# Patient Record
Sex: Female | Born: 1995 | Hispanic: Refuse to answer | Marital: Single | Smoking: Never smoker
Health system: Southern US, Community
[De-identification: ages and names within clinical notes are randomized; demographics above are authoritative.]

## PROBLEM LIST (undated history)

## (undated) DIAGNOSIS — F419 Anxiety disorder, unspecified: Secondary | ICD-10-CM

## (undated) DIAGNOSIS — M25552 Pain in left hip: Secondary | ICD-10-CM

## (undated) DIAGNOSIS — M25551 Pain in right hip: Secondary | ICD-10-CM

## (undated) DIAGNOSIS — G8929 Other chronic pain: Secondary | ICD-10-CM

## (undated) DIAGNOSIS — N390 Urinary tract infection, site not specified: Secondary | ICD-10-CM

## (undated) HISTORY — DX: Anxiety disorder, unspecified: F41.9

---
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
## 2016-04-09 DIAGNOSIS — M9902 Segmental and somatic dysfunction of thoracic region: Secondary | ICD-10-CM | POA: Diagnosis not present

## 2016-04-09 DIAGNOSIS — M9901 Segmental and somatic dysfunction of cervical region: Secondary | ICD-10-CM | POA: Diagnosis not present

## 2016-04-09 DIAGNOSIS — M9903 Segmental and somatic dysfunction of lumbar region: Secondary | ICD-10-CM | POA: Diagnosis not present

## 2016-04-09 DIAGNOSIS — M9905 Segmental and somatic dysfunction of pelvic region: Secondary | ICD-10-CM | POA: Diagnosis not present

## 2016-04-13 DIAGNOSIS — M7521 Bicipital tendinitis, right shoulder: Secondary | ICD-10-CM | POA: Diagnosis not present

## 2016-05-02 DIAGNOSIS — J029 Acute pharyngitis, unspecified: Secondary | ICD-10-CM | POA: Diagnosis not present

## 2016-05-15 DIAGNOSIS — M9902 Segmental and somatic dysfunction of thoracic region: Secondary | ICD-10-CM | POA: Diagnosis not present

## 2016-05-15 DIAGNOSIS — M9905 Segmental and somatic dysfunction of pelvic region: Secondary | ICD-10-CM | POA: Diagnosis not present

## 2016-05-15 DIAGNOSIS — M9903 Segmental and somatic dysfunction of lumbar region: Secondary | ICD-10-CM | POA: Diagnosis not present

## 2016-05-15 DIAGNOSIS — M9901 Segmental and somatic dysfunction of cervical region: Secondary | ICD-10-CM | POA: Diagnosis not present

## 2016-05-30 DIAGNOSIS — M9903 Segmental and somatic dysfunction of lumbar region: Secondary | ICD-10-CM | POA: Diagnosis not present

## 2016-05-30 DIAGNOSIS — M9905 Segmental and somatic dysfunction of pelvic region: Secondary | ICD-10-CM | POA: Diagnosis not present

## 2016-05-30 DIAGNOSIS — M9902 Segmental and somatic dysfunction of thoracic region: Secondary | ICD-10-CM | POA: Diagnosis not present

## 2016-05-30 DIAGNOSIS — M9901 Segmental and somatic dysfunction of cervical region: Secondary | ICD-10-CM | POA: Diagnosis not present

## 2016-06-07 DIAGNOSIS — M9905 Segmental and somatic dysfunction of pelvic region: Secondary | ICD-10-CM | POA: Diagnosis not present

## 2016-06-07 DIAGNOSIS — M9903 Segmental and somatic dysfunction of lumbar region: Secondary | ICD-10-CM | POA: Diagnosis not present

## 2016-06-07 DIAGNOSIS — M9902 Segmental and somatic dysfunction of thoracic region: Secondary | ICD-10-CM | POA: Diagnosis not present

## 2016-06-07 DIAGNOSIS — M9901 Segmental and somatic dysfunction of cervical region: Secondary | ICD-10-CM | POA: Diagnosis not present

## 2016-06-13 DIAGNOSIS — M9902 Segmental and somatic dysfunction of thoracic region: Secondary | ICD-10-CM | POA: Diagnosis not present

## 2016-06-13 DIAGNOSIS — M9901 Segmental and somatic dysfunction of cervical region: Secondary | ICD-10-CM | POA: Diagnosis not present

## 2016-06-13 DIAGNOSIS — M9905 Segmental and somatic dysfunction of pelvic region: Secondary | ICD-10-CM | POA: Diagnosis not present

## 2016-06-13 DIAGNOSIS — M9903 Segmental and somatic dysfunction of lumbar region: Secondary | ICD-10-CM | POA: Diagnosis not present

## 2016-06-19 DIAGNOSIS — B373 Candidiasis of vulva and vagina: Secondary | ICD-10-CM | POA: Diagnosis not present

## 2016-06-19 DIAGNOSIS — R350 Frequency of micturition: Secondary | ICD-10-CM | POA: Diagnosis not present

## 2016-07-03 DIAGNOSIS — M9903 Segmental and somatic dysfunction of lumbar region: Secondary | ICD-10-CM | POA: Diagnosis not present

## 2016-07-03 DIAGNOSIS — M9901 Segmental and somatic dysfunction of cervical region: Secondary | ICD-10-CM | POA: Diagnosis not present

## 2016-07-03 DIAGNOSIS — M9905 Segmental and somatic dysfunction of pelvic region: Secondary | ICD-10-CM | POA: Diagnosis not present

## 2016-07-03 DIAGNOSIS — M9902 Segmental and somatic dysfunction of thoracic region: Secondary | ICD-10-CM | POA: Diagnosis not present

## 2016-07-06 DIAGNOSIS — Z01419 Encounter for gynecological examination (general) (routine) without abnormal findings: Secondary | ICD-10-CM | POA: Diagnosis not present

## 2016-07-06 DIAGNOSIS — Z23 Encounter for immunization: Secondary | ICD-10-CM | POA: Diagnosis not present

## 2016-07-10 DIAGNOSIS — M9902 Segmental and somatic dysfunction of thoracic region: Secondary | ICD-10-CM | POA: Diagnosis not present

## 2016-07-10 DIAGNOSIS — M9905 Segmental and somatic dysfunction of pelvic region: Secondary | ICD-10-CM | POA: Diagnosis not present

## 2016-07-10 DIAGNOSIS — M9903 Segmental and somatic dysfunction of lumbar region: Secondary | ICD-10-CM | POA: Diagnosis not present

## 2016-07-10 DIAGNOSIS — M9901 Segmental and somatic dysfunction of cervical region: Secondary | ICD-10-CM | POA: Diagnosis not present

## 2016-07-12 DIAGNOSIS — M9901 Segmental and somatic dysfunction of cervical region: Secondary | ICD-10-CM | POA: Diagnosis not present

## 2016-07-12 DIAGNOSIS — M9905 Segmental and somatic dysfunction of pelvic region: Secondary | ICD-10-CM | POA: Diagnosis not present

## 2016-07-12 DIAGNOSIS — M9903 Segmental and somatic dysfunction of lumbar region: Secondary | ICD-10-CM | POA: Diagnosis not present

## 2016-07-12 DIAGNOSIS — M9902 Segmental and somatic dysfunction of thoracic region: Secondary | ICD-10-CM | POA: Diagnosis not present

## 2016-09-28 DIAGNOSIS — F411 Generalized anxiety disorder: Secondary | ICD-10-CM | POA: Diagnosis not present

## 2016-10-22 ENCOUNTER — Ambulatory Visit (INDEPENDENT_AMBULATORY_CARE_PROVIDER_SITE_OTHER): Payer: BLUE CROSS/BLUE SHIELD | Admitting: Family Medicine

## 2016-10-22 ENCOUNTER — Other Ambulatory Visit: Payer: Self-pay | Admitting: Family Medicine

## 2016-10-22 ENCOUNTER — Encounter: Payer: Self-pay | Admitting: Family Medicine

## 2016-10-22 ENCOUNTER — Ambulatory Visit
Admission: RE | Admit: 2016-10-22 | Discharge: 2016-10-22 | Disposition: A | Discharge status: Home / Self Care | Payer: BLUE CROSS/BLUE SHIELD | Source: Ambulatory Visit | Attending: Family Medicine | Admitting: Family Medicine

## 2016-10-22 VITALS — BP 116/68 | HR 76 | Temp 98.6°F | Resp 14

## 2016-10-22 DIAGNOSIS — J069 Acute upper respiratory infection, unspecified: Secondary | ICD-10-CM

## 2016-10-22 DIAGNOSIS — R059 Cough, unspecified: Secondary | ICD-10-CM

## 2016-10-22 DIAGNOSIS — R05 Cough: Secondary | ICD-10-CM

## 2016-10-22 LAB — POCT INFLUENZA A/B
Influenza A, POC: NEGATIVE
Influenza B, POC: NEGATIVE

## 2016-10-22 MED ORDER — METHYLPREDNISOLONE 4 MG PO TBPK: ORAL_TABLET | ORAL | 0 refills | Status: AC

## 2016-10-22 NOTE — Progress Notes (Signed)
Patient presents today with symptoms of productive cough and ear pain. Patient states that she was diagnosed with bronchitis and otitis media at the [facility]. She was put on Zithromax. Patient states that initially she did have fever and body aches but that has resolved. She has an inhaler to use as needed as well. She denies any chest pain, abdominal pain, severe headache, nausea, vomiting, diarrhea, urinary symptoms. She has been taking Mucinex as well for her symptoms.  ROS: Negative except mentioned above. Vitals as per Epic.  GENERAL: NAD HEENT: mild pharyngeal erythema, no exudate, mild erythema of TMs bilaterally, no cervical LAD RESP: Mildly decreased breath sounds at the bases, mild wheezing, no accessory muscle use noted CARD: RRR NEURO: CN II-XII grossly intact   A/P: URI, Otitis Media - will do chest x-ray to evaluate for pneumonia, Influenza screen was negative, Delsym when necessary, prednisone tapered dose prescribed, Albuterol Inhaler when necessary, continue Z-Pak. Seek medical attention if symptoms persist or worsen as discussed. No athletic activity for now.

## 2016-11-02 DIAGNOSIS — Z3041 Encounter for surveillance of contraceptive pills: Secondary | ICD-10-CM | POA: Diagnosis not present

## 2016-11-15 DIAGNOSIS — F411 Generalized anxiety disorder: Secondary | ICD-10-CM | POA: Diagnosis not present

## 2016-11-30 DIAGNOSIS — Z23 Encounter for immunization: Secondary | ICD-10-CM | POA: Diagnosis not present

## 2017-01-15 ENCOUNTER — Encounter: Payer: Self-pay | Admitting: Family Medicine

## 2017-01-15 ENCOUNTER — Ambulatory Visit (INDEPENDENT_AMBULATORY_CARE_PROVIDER_SITE_OTHER): Payer: BLUE CROSS/BLUE SHIELD | Admitting: Family Medicine

## 2017-01-15 VITALS — BP 114/70 | HR 81 | Temp 98.1°F | Resp 14

## 2017-01-15 DIAGNOSIS — J029 Acute pharyngitis, unspecified: Secondary | ICD-10-CM | POA: Diagnosis not present

## 2017-01-15 DIAGNOSIS — R509 Fever, unspecified: Secondary | ICD-10-CM | POA: Diagnosis not present

## 2017-01-15 NOTE — Progress Notes (Signed)
Patient presents today with symptoms of fatigue, sore throat, headache, subjective fever/chills, minimal cough for the last few days. She states that she is falling asleep in class she is so tired. Her last menstrual period was last week. She denies any chest pain, shortness of breath, nausea, vomiting, diarrhea. She admits to some abdominal bloating last week which she related to her menstrual cycle. She denies any joint pain. She does have some myalgias. She denies any neck stiffness or photophobia. She rates her sore throat as a 3 or 4 pain level out of 10. She does complain of some discomfort in the left cervical lymph node area including in the submandibular area. There is no obvious swelling of her face that she describes. She denies any problems with chewing. She admits to some sinus pressure in the frontal and maxillary sinuses. She has had some rhinorrhea as well. She denies any history of mono in the past. She denies any history of Lyme's disease or RMSF. She denies any recent tick bites.  ROS: Negative except mentioned above. Vitals as per Epic.  GENERAL: NAD HEENT: mild pharyngeal erythema, no exudate, no erythema of TMs, mild cervical LAD, no tenderness in the parotid area or submandibular area, there is no asymmetry of the face, mild frontal and maxillary sinus tenderness bilaterally RESP: CTA B CARD: RRR ABD: +BS, NT, no organomegly appreciated  NEURO: CN II-XII grossly intact, negative meningeal signs  A/P: Viral illness - rapid strep test and flu screen were negative in the office, CBC and mono spot test were ordered, given a few athletes at other [employer] with mumps being diagnosed I have ordered titers for mumps. This is unlikely but will test given above. I have asked the patient to monitor for any worsening symptoms. She will do no athletic activity at this time until results have been reviewed. She is to rest, hydrate, take Tylenol/Motrin when necessary, Claritin when necessary  for nasal drainage. Seek medical attention as discussed if any worsening symptoms. Discussed above with trainer.

## 2017-01-17 ENCOUNTER — Emergency Department
Admission: EM | Admit: 2017-01-17 | Discharge: 2017-01-17 | Disposition: A | Discharge status: Home / Self Care | Payer: BLUE CROSS/BLUE SHIELD | Attending: Student in an Organized Health Care Education/Training Program | Admitting: Student in an Organized Health Care Education/Training Program

## 2017-01-17 ENCOUNTER — Encounter: Payer: Self-pay | Admitting: Emergency Medicine

## 2017-01-17 DIAGNOSIS — M2560 Stiffness of unspecified joint, not elsewhere classified: Secondary | ICD-10-CM | POA: Diagnosis not present

## 2017-01-17 DIAGNOSIS — J029 Acute pharyngitis, unspecified: Secondary | ICD-10-CM | POA: Insufficient documentation

## 2017-01-17 DIAGNOSIS — R51 Headache: Secondary | ICD-10-CM

## 2017-01-17 DIAGNOSIS — R5383 Other fatigue: Secondary | ICD-10-CM | POA: Diagnosis not present

## 2017-01-17 DIAGNOSIS — R519 Headache, unspecified: Secondary | ICD-10-CM

## 2017-01-17 DIAGNOSIS — B09 Unspecified viral infection characterized by skin and mucous membrane lesions: Secondary | ICD-10-CM | POA: Diagnosis not present

## 2017-01-17 LAB — URINALYSIS, COMPLETE (UACMP) WITH MICROSCOPIC
BILIRUBIN URINE: NEGATIVE
GLUCOSE, UA: NEGATIVE mg/dL
HGB URINE DIPSTICK: NEGATIVE
KETONES UR: NEGATIVE mg/dL
LEUKOCYTES UA: NEGATIVE
NITRITE: NEGATIVE
PH: 6 (ref 5.0–8.0)
Protein, ur: NEGATIVE mg/dL
Specific Gravity, Urine: 1.016 (ref 1.005–1.030)

## 2017-01-17 LAB — COMPREHENSIVE METABOLIC PANEL
ALT: 19 U/L (ref 14–54)
ANION GAP: 9 (ref 5–15)
AST: 26 U/L (ref 15–41)
Albumin: 4.5 g/dL (ref 3.5–5.0)
Alkaline Phosphatase: 57 U/L (ref 38–126)
BILIRUBIN TOTAL: 0.6 mg/dL (ref 0.3–1.2)
BUN: 17 mg/dL (ref 6–20)
CHLORIDE: 101 mmol/L (ref 101–111)
CO2: 29 mmol/L (ref 22–32)
Calcium: 9.7 mg/dL (ref 8.9–10.3)
Creatinine, Ser: 0.96 mg/dL (ref 0.44–1.00)
GFR calc Af Amer: >60 mL/min (ref >60)
Glucose, Bld: 94 mg/dL (ref 65–99)
POTASSIUM: 4.1 mmol/L (ref 3.5–5.1)
Sodium: 139 mmol/L (ref 135–145)
TOTAL PROTEIN: 8.1 g/dL (ref 6.5–8.1)

## 2017-01-17 LAB — CBC WITH DIFFERENTIAL/PLATELET
BASOS: 0 %
Basophils Absolute: 0 10*3/uL (ref 0.0–0.2)
Basophils Absolute: 0 10*3/uL (ref 0–0.1)
Basophils Relative: 1 %
EOS (ABSOLUTE): 0 10*3/uL (ref 0.0–0.4)
EOS PCT: 3 %
Eos: 0 %
Eosinophils Absolute: 0.1 10*3/uL (ref 0–0.7)
HEMATOCRIT: 43.6 % (ref 34.0–46.6)
HEMATOCRIT: 47 % (ref 35.0–47.0)
Hemoglobin: 14.9 g/dL (ref 11.1–15.9)
Hemoglobin: 15.9 g/dL (ref 12.0–16.0)
IMMATURE GRANS (ABS): 0 10*3/uL (ref 0.0–0.1)
IMMATURE GRANULOCYTES: 0 %
LYMPHS PCT: 47 %
LYMPHS: 25 %
Lymphocytes Absolute: 1.2 10*3/uL (ref 0.7–3.1)
Lymphs Abs: 2.5 10*3/uL (ref 1.0–3.6)
MCH: 29 pg (ref 26.6–33.0)
MCH: 29 pg (ref 26.0–34.0)
MCHC: 34.2 g/dL (ref 31.5–35.7)
MCHC: 33.9 g/dL (ref 32.0–36.0)
MCV: 85 fL (ref 79–97)
MCV: 85.6 fL (ref 80.0–100.0)
MONO ABS: 0.5 10*3/uL (ref 0.2–0.9)
MONOCYTES: 15 %
MONOS PCT: 10 %
Monocytes Absolute: 0.8 10*3/uL (ref 0.1–0.9)
NEUTROS ABS: 2.9 10*3/uL (ref 1.4–7.0)
NEUTROS ABS: 2.1 10*3/uL (ref 1.4–6.5)
NEUTROS PCT: 60 %
Neutrophils Relative %: 39 %
PLATELETS: 279 10*3/uL (ref 150–379)
PLATELETS: 286 10*3/uL (ref 150–440)
RBC: 5.13 x10E6/uL (ref 3.77–5.28)
RBC: 5.5 MIL/uL — ABNORMAL HIGH (ref 3.80–5.20)
RDW: 12.5 % (ref 12.3–15.4)
RDW: 12.5 % (ref 11.5–14.5)
WBC: 5 10*3/uL (ref 3.4–10.8)
WBC: 5.3 10*3/uL (ref 3.6–11.0)

## 2017-01-17 LAB — POCT INFLUENZA A/B
INFLUENZA B, POC: NEGATIVE
Influenza A, POC: NEGATIVE

## 2017-01-17 LAB — POCT RAPID STREP A (OFFICE): RAPID STREP A SCREEN: NEGATIVE

## 2017-01-17 LAB — POCT PREGNANCY, URINE: Preg Test, Ur: NEGATIVE

## 2017-01-17 LAB — MUMPS ANTIBODY, IGM: Mumps IgM: <0.8 AU (ref 0.00–0.79)

## 2017-01-17 LAB — MUMPS ANTIBODY, IGG: MUMPS ABS, IGG: 27.2 [AU]/ml (ref >10.9)

## 2017-01-17 LAB — MONONUCLEOSIS SCREEN: Mono Screen: NEGATIVE

## 2017-01-17 MED ORDER — DIPHENHYDRAMINE HCL 50 MG/ML IJ SOLN
INTRAMUSCULAR | Status: AC
  Administered 2017-01-17: 25 mg via INTRAVENOUS
  Filled 2017-01-17: qty 1

## 2017-01-17 MED ORDER — DIPHENHYDRAMINE HCL 50 MG/ML IJ SOLN
25.0000 mg | Freq: Once | INTRAMUSCULAR | Status: AC
  Administered 2017-01-17: 25 mg via INTRAVENOUS

## 2017-01-17 MED ORDER — DEXAMETHASONE SODIUM PHOSPHATE 10 MG/ML IJ SOLN
INTRAMUSCULAR | Status: AC
  Administered 2017-01-17: 10 mg via INTRAVENOUS
  Filled 2017-01-17: qty 1

## 2017-01-17 MED ORDER — SODIUM CHLORIDE 0.9 % IV BOLUS (SEPSIS)
1000.0000 mL | Freq: Once | INTRAVENOUS | Status: AC
Start: 2017-01-17 — End: 2017-01-17
  Administered 2017-01-17: 1000 mL via INTRAVENOUS

## 2017-01-17 MED ORDER — PROCHLORPERAZINE EDISYLATE 5 MG/ML IJ SOLN
INTRAMUSCULAR | Status: AC
  Administered 2017-01-17: 10 mg via INTRAVENOUS
  Filled 2017-01-17: qty 2

## 2017-01-17 MED ORDER — DEXAMETHASONE SODIUM PHOSPHATE 10 MG/ML IJ SOLN
10.0000 mg | Freq: Once | INTRAMUSCULAR | Status: AC
  Administered 2017-01-17: 10 mg via INTRAVENOUS

## 2017-01-17 MED ORDER — DEXAMETHASONE 6 MG PO TABS: 6.0000 mg | ORAL_TABLET | Freq: Once | ORAL | 0 refills | Status: AC

## 2017-01-17 MED ORDER — PROCHLORPERAZINE EDISYLATE 5 MG/ML IJ SOLN
10.0000 mg | Freq: Once | INTRAMUSCULAR | Status: AC
Start: 2017-01-17 — End: 2017-01-17
  Administered 2017-01-17: 10 mg via INTRAVENOUS

## 2017-01-17 NOTE — ED Provider Notes (Signed)
[facility] Emergency Department Provider Note    First MD Initiated Contact with Patient 01/17/17 2010     (approximate)  I have reviewed the triage vital signs and the nursing notes.   HISTORY  Chief Complaint Headache; Neck Pain; and Rash    HPI Brooke Parker is a 21 y.o. female several days of sore throat headache and myalgias and fatigue. [facility]. No known sick contacts. Strep and flu were negative. Rapid Mono was negative but that has been sent out. 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 Symptoms are similar to her nurse. [facility] and they directed her to the ER due to "concern for meningitis "patient arrives well appearing. She does have some neck discomfort but is looking about the room with no obvious pain. She is well-perfused. States that her primary symptoms started with a sore throat.   Past Medical History:  Diagnosis Date  . Anxiety    Family History  Problem Relation Age of Onset  . Hypertension Sister   . Autoimmune disease Sister   . Cancer Maternal Grandmother   . Cancer Maternal Grandfather   . Cancer Paternal Grandmother   . Hyperlipidemia Paternal Grandmother   . Hypertension Paternal Grandmother   . Kidney failure Paternal Grandfather    Past Surgical History:  Procedure Laterality Date  . SHOULDER SURGERY Right 2016   There are no active problems to display for this patient.     Prior to Admission medications   Medication Sig Start Date End Date Taking? Authorizing Provider  azithromycin (ZITHROMAX) 1 g powder Take 1 g by mouth once.    Historical Provider, MD  citalopram (CELEXA) 20 MG tablet Take 20 mg by mouth daily.    Historical Provider, MD  dexamethasone (DECADRON) 6 MG tablet Take 1 tablet (6 mg total) by mouth once. 01/19/17 01/19/17  Willy Eddy, MD  methylPREDNISolone (MEDROL  DOSEPAK) 4 MG TBPK tablet Tapered 6 day course of medication. Patient not taking: Reported on 01/15/2017 10/22/16   Jolene Provost, MD  norethindrone-ethinyl estradiol-iron (ESTROSTEP FE,TILIA FE,TRI-LEGEST FE) 1-20/1-30/1-35 MG-MCG tablet Take 1 tablet by mouth daily.    Historical Provider, MD    Allergies Omnicef [cefdinir]; Penicillins; and Sulfa antibiotics    Social History Social History  Substance Use Topics  . Smoking status: Never Smoker  . Smokeless tobacco: Never Used  . Alcohol use Yes     Comment: occ    Review of Systems Patient denies headaches, rhinorrhea, blurry vision, numbness, shortness of breath, chest pain, edema, cough, abdominal pain, nausea, vomiting, diarrhea, dysuria, fevers, rashes or hallucinations unless otherwise stated above in HPI. ____________________________________________   PHYSICAL EXAM:  VITAL SIGNS: Vitals:   01/17/17 1946 01/17/17 1948  BP:  (!) 146/94  Pulse:  63  Resp:  18  Temp: 98.2 F (36.8 C)     Constitutional: Alert and oriented. Well appearing and in no acute distress. Eyes: Conjunctivae are normal. PERRL. EOMI. Head: Atraumatic. Nose: No congestion/rhinnorhea. Mouth/Throat: Mucous membranes are moist.  Oropharynx with erythematous tonsils bilaterally, uvula is midline Neck: No stridor. Painless ROM. No cervical spine tenderness to palpation Hematological/Lymphatic/Immunilogical: + cervical lymphadenopathy. And occipital lymphadenopathy Cardiovascular: Normal rate, regular rhythm. Grossly normal heart sounds.  Good peripheral circulation. Respiratory: Normal respiratory effort.  No retractions. Lungs CTAB. Gastrointestinal: Soft and nontender. No distention. No abdominal bruits. No  CVA tenderness. Musculoskeletal: No lower extremity tenderness nor edema.  No joint effusions. Neurologic:  Normal speech and language. No gross focal neurologic deficits are appreciated. No gait instability. Skin:  Skin is warm, dry and  intact. Dry no erythematous rash to extensor surface of elbows and knees Psychiatric: Mood and affect are normal. Speech and behavior are normal.  ____________________________________________   LABS (all labs ordered are listed, but only abnormal results are displayed)  Results for orders placed or performed during the hospital encounter of 01/17/17 (from the past 24 hour(s))  CBC with Differential     Status: Abnormal   Collection Time: 01/17/17  7:56 PM  Result Value Ref Range   WBC 5.3 3.6 - 11.0 K/uL   RBC 5.50 (H) 3.80 - 5.20 MIL/uL   Hemoglobin 15.9 12.0 - 16.0 g/dL   HCT 40.947.0 81.135.0 - 91.447.0 %   MCV 85.6 80.0 - 100.0 fL   MCH 29.0 26.0 - 34.0 pg   MCHC 33.9 32.0 - 36.0 g/dL   RDW 78.212.5 95.611.5 - 21.314.5 %   Platelets 286 150 - 440 K/uL   Neutrophils Relative % 39 %   Neutro Abs 2.1 1.4 - 6.5 K/uL   Lymphocytes Relative 47 %   Lymphs Abs 2.5 1.0 - 3.6 K/uL   Monocytes Relative 10 %   Monocytes Absolute 0.5 0.2 - 0.9 K/uL   Eosinophils Relative 3 %   Eosinophils Absolute 0.1 0 - 0.7 K/uL   Basophils Relative 1 %   Basophils Absolute 0.0 0 - 0.1 K/uL  Comprehensive metabolic panel     Status: None   Collection Time: 01/17/17  7:56 PM  Result Value Ref Range   Sodium 139 135 - 145 mmol/L   Potassium 4.1 3.5 - 5.1 mmol/L   Chloride 101 101 - 111 mmol/L   CO2 29 22 - 32 mmol/L   Glucose, Bld 94 65 - 99 mg/dL   BUN 17 6 - 20 mg/dL   Creatinine, Ser 0.860.96 0.44 - 1.00 mg/dL   Calcium 9.7 8.9 - 57.810.3 mg/dL   Total Protein 8.1 6.5 - 8.1 g/dL   Albumin 4.5 3.5 - 5.0 g/dL   AST 26 15 - 41 U/L   ALT 19 14 - 54 U/L   Alkaline Phosphatase 57 38 - 126 U/L   Total Bilirubin 0.6 0.3 - 1.2 mg/dL   GFR calc non Af Amer >60 >60 mL/min   GFR calc Af Amer >60 >60 mL/min   Anion gap 9 5 - 15  Urinalysis, Complete w Microscopic     Status: Abnormal   Collection Time: 01/17/17  7:56 PM  Result Value Ref Range   Color, Urine YELLOW (A) YELLOW   APPearance CLEAR (A) CLEAR   Specific Gravity,  Urine 1.016 1.005 - 1.030   pH 6.0 5.0 - 8.0   Glucose, UA NEGATIVE NEGATIVE mg/dL   Hgb urine dipstick NEGATIVE NEGATIVE   Bilirubin Urine NEGATIVE NEGATIVE   Ketones, ur NEGATIVE NEGATIVE mg/dL   Protein, ur NEGATIVE NEGATIVE mg/dL   Nitrite NEGATIVE NEGATIVE   Leukocytes, UA NEGATIVE NEGATIVE   RBC / HPF 0-5 0 - 5 RBC/hpf   WBC, UA 0-5 0 - 5 WBC/hpf   Bacteria, UA RARE (A) NONE SEEN   Squamous Epithelial / LPF 0-5 (A) NONE SEEN   Mucous PRESENT   Pregnancy, urine POC     Status: None   Collection Time: 01/17/17  8:07 PM  Result Value Ref Range   Preg  Test, Ur NEGATIVE NEGATIVE   ____________________________________________  EKG____________________________________________  RADIOLOGY   ____________________________________________   PROCEDURES  Procedure(s) performed:  Procedures    Critical Care performed: no ____________________________________________   INITIAL IMPRESSION / ASSESSMENT AND PLAN / ED COURSE  Pertinent labs & imaging results that were available during my care of the patient were reviewed by me and considered in my medical decision making (see chart for details).  DDX: fli, mono, strep, uri  Takiya Belmares is a 21 y.o. who presents to the ED with flulike illness. She rises afebrile and hemodynamically stable. [facility] however patient is well-appearing, has no meningismus, has a mild headache and her primary symptoms or sore throat. This is not clinically consistent with meningitis. Patient agrees that she does not feel that this is acute meningitis. I did discuss with her that the only definitive way to diagnose a process such as meningitis viral or bacterial is to perform a lumbar puncture. This point I did discuss my thought process and feeling that the risks associated with this invasive procedure without way the likelihood of her actually having a positive result and she agrees. Presentation is most  consistent with some component of viral illness. Less likely strep throat. Possible mono. Possible flu but patient is out of the window for Tamiflu. We'll provide symptomatic management for her headache and sore throat. We'll provide IV fluids.  Clinical Course as of Jan 18 2224  Thu Jan 17, 2017  2223 Patient seen medically improved. States headache has resolved. He remains hemodynamically stable. Also improved after Decadron. States her sore throat is feeling much better. Do feel patient is stable for close follow-up with her outpatient physician.  Patient was able to tolerate PO and was able to ambulate with a steady gait.  Have discussed with the patient and available family all diagnostics and treatments performed thus far and all questions were answered to the best of my ability. The patient demonstrates understanding and agreement with plan.   [PR]    Clinical Course User Index [PR] Willy Eddy, MD     ____________________________________________   FINAL CLINICAL IMPRESSION(S) / ED DIAGNOSES  Final diagnoses:  Pharyngitis, unspecified etiology  Acute nonintractable headache, unspecified headache type      NEW MEDICATIONS STARTED DURING THIS VISIT:  New Prescriptions   DEXAMETHASONE (DECADRON) 6 MG TABLET    Take 1 tablet (6 mg total) by mouth once.     Note:  This document was prepared using Dragon voice recognition software and may include unintentional dictation errors.    Willy Eddy, MD 01/17/17 2225

## 2017-01-17 NOTE — ED Notes (Signed)
Pt has been given water per MD order to PO challenge. Pt able to eat pizza without increased nausea. Pt lying in bed and reports decreased head pain.

## 2017-01-17 NOTE — ED Triage Notes (Addendum)
Patient states that she has been feeling tired times one week. Patient reports that 2 days ago she started having a severe headache, sore throat and neck stiffness 2 days ago. Patient states that yesterday she developed a rash to bilateral arms and diarrhea. Patient states that [facility] and sent here to rule out viral meningitis.

## 2017-01-17 NOTE — ED Notes (Addendum)
Pt refusing flu swab at this time, states [facility]  Dr. Roxan Hockeyobinson aware

## 2017-01-17 NOTE — ED Notes (Signed)
Pt reports rash starting yesterday on elbows, spreading to hands, redness noted.  Pt reports HA, sore throat, sore neck.  Pt reports nausea w/o vomiting and diarrhea.

## 2017-01-17 NOTE — Discharge Instructions (Signed)

## 2017-01-18 ENCOUNTER — Other Ambulatory Visit
Admission: RE | Admit: 2017-01-18 | Discharge: 2017-01-18 | Disposition: A | Discharge status: Home / Self Care | Payer: BLUE CROSS/BLUE SHIELD | Source: Ambulatory Visit | Attending: Family Medicine | Admitting: Family Medicine

## 2017-01-18 ENCOUNTER — Other Ambulatory Visit: Payer: Self-pay | Admitting: Family Medicine

## 2017-01-18 DIAGNOSIS — R591 Generalized enlarged lymph nodes: Secondary | ICD-10-CM | POA: Insufficient documentation

## 2017-01-19 LAB — MEASLES/MUMPS/RUBELLA IMMUNITY
Mumps IgG: 12.7 AU/mL (ref >10.9)
RUBELLA: 3.2 {index} (ref >0.99)

## 2017-01-21 LAB — EPSTEIN-BARR VIRUS VCA ANTIBODY PANEL
EBV NA IGG: 36.6 U/mL — AB (ref 0.0–17.9)
EBV VCA IgG: 547 U/mL — ABNORMAL HIGH (ref 0.0–17.9)

## 2017-01-22 LAB — TORCH-IGM(TOXO/ RUB/ CMV/ HSV) W TITER
CMV IgM: <30 AU/mL (ref 0.0–29.9)
HSVI/II Comb IgM: <0.91 Ratio (ref 0.00–0.90)
Toxoplasma Antibody- IgM: <3 AU/mL (ref 0.0–7.9)

## 2017-01-22 LAB — INFECT DISEASE AB IGM REFLEX 1

## 2017-03-14 ENCOUNTER — Ambulatory Visit (INDEPENDENT_AMBULATORY_CARE_PROVIDER_SITE_OTHER): Payer: BLUE CROSS/BLUE SHIELD | Admitting: Family Medicine

## 2017-03-14 ENCOUNTER — Encounter: Payer: Self-pay | Admitting: Family Medicine

## 2017-03-14 VITALS — BP 113/70 | HR 66 | Temp 97.7°F | Resp 14

## 2017-03-14 DIAGNOSIS — M26629 Arthralgia of temporomandibular joint, unspecified side: Secondary | ICD-10-CM

## 2017-03-14 MED ORDER — NAPROXEN 500 MG PO TABS: 500.0000 mg | ORAL_TABLET | Freq: Two times a day (BID) | ORAL | 0 refills | Status: DC

## 2017-03-14 NOTE — Progress Notes (Signed)
Patient presents today with symptoms of left jaw pain. Patient states that the pain is in her left TMJ area. She admits that a few days ago she was eating and Rx Bar when her jaw popped and she wasn't able to open her mouth wide. After some time this resolved and she was able to open her mouth again. She denies this ever happening before. She denies any injury to the jaw. She denies any tooth pain. She denies a history of grinding her teeth. She denies wearing a retainer. She describes the area is sore now. Patient denies any excessive stress in her life.  ROS: Negative except mentioned above.  GENERAL: NAD HEENT: no pharyngeal erythema, no exudate, no erythema of TMs, no cervical LAD, no obvious swelling of the left TMJ area, there is mild tenderness of the left TMJ area, she is able to open up her mouth wide, there is no parotid gland swelling RESP: CTA B CARD: RRR NEURO: CN II-XII grossly intact   A/P: Left TMJ pain - sounds like patient had a subluxation event, recommend patient avoid excessive chewing at this time, Naprosyn when necessary, would follow-up with dentist/oral surgeon if symptoms persist/worsen. Patient states that if her symptoms persist/worsen she will contact her dentist in [place]. Would recommend that any imaging be done by dentist/oral surgeon if needed.

## 2017-05-07 DIAGNOSIS — M9901 Segmental and somatic dysfunction of cervical region: Secondary | ICD-10-CM | POA: Diagnosis not present

## 2017-05-07 DIAGNOSIS — M9905 Segmental and somatic dysfunction of pelvic region: Secondary | ICD-10-CM | POA: Diagnosis not present

## 2017-05-07 DIAGNOSIS — M9903 Segmental and somatic dysfunction of lumbar region: Secondary | ICD-10-CM | POA: Diagnosis not present

## 2017-05-07 DIAGNOSIS — M9902 Segmental and somatic dysfunction of thoracic region: Secondary | ICD-10-CM | POA: Diagnosis not present

## 2017-05-31 DIAGNOSIS — B2790 Infectious mononucleosis, unspecified without complication: Secondary | ICD-10-CM | POA: Diagnosis not present

## 2017-05-31 DIAGNOSIS — R5383 Other fatigue: Secondary | ICD-10-CM | POA: Diagnosis not present

## 2017-05-31 DIAGNOSIS — F411 Generalized anxiety disorder: Secondary | ICD-10-CM | POA: Diagnosis not present

## 2017-06-17 DIAGNOSIS — M9903 Segmental and somatic dysfunction of lumbar region: Secondary | ICD-10-CM | POA: Diagnosis not present

## 2017-06-17 DIAGNOSIS — M9902 Segmental and somatic dysfunction of thoracic region: Secondary | ICD-10-CM | POA: Diagnosis not present

## 2017-06-17 DIAGNOSIS — M9905 Segmental and somatic dysfunction of pelvic region: Secondary | ICD-10-CM | POA: Diagnosis not present

## 2017-06-17 DIAGNOSIS — M9901 Segmental and somatic dysfunction of cervical region: Secondary | ICD-10-CM | POA: Diagnosis not present

## 2017-06-18 DIAGNOSIS — M9902 Segmental and somatic dysfunction of thoracic region: Secondary | ICD-10-CM | POA: Diagnosis not present

## 2017-06-18 DIAGNOSIS — M9901 Segmental and somatic dysfunction of cervical region: Secondary | ICD-10-CM | POA: Diagnosis not present

## 2017-06-18 DIAGNOSIS — M9903 Segmental and somatic dysfunction of lumbar region: Secondary | ICD-10-CM | POA: Diagnosis not present

## 2017-06-18 DIAGNOSIS — M9905 Segmental and somatic dysfunction of pelvic region: Secondary | ICD-10-CM | POA: Diagnosis not present

## 2017-06-20 DIAGNOSIS — M9902 Segmental and somatic dysfunction of thoracic region: Secondary | ICD-10-CM | POA: Diagnosis not present

## 2017-06-20 DIAGNOSIS — M9903 Segmental and somatic dysfunction of lumbar region: Secondary | ICD-10-CM | POA: Diagnosis not present

## 2017-06-20 DIAGNOSIS — M9901 Segmental and somatic dysfunction of cervical region: Secondary | ICD-10-CM | POA: Diagnosis not present

## 2017-06-20 DIAGNOSIS — M9905 Segmental and somatic dysfunction of pelvic region: Secondary | ICD-10-CM | POA: Diagnosis not present

## 2017-07-08 DIAGNOSIS — Z01419 Encounter for gynecological examination (general) (routine) without abnormal findings: Secondary | ICD-10-CM | POA: Diagnosis not present

## 2017-10-08 DIAGNOSIS — M9901 Segmental and somatic dysfunction of cervical region: Secondary | ICD-10-CM | POA: Diagnosis not present

## 2017-10-08 DIAGNOSIS — M9905 Segmental and somatic dysfunction of pelvic region: Secondary | ICD-10-CM | POA: Diagnosis not present

## 2017-10-08 DIAGNOSIS — M9902 Segmental and somatic dysfunction of thoracic region: Secondary | ICD-10-CM | POA: Diagnosis not present

## 2017-10-08 DIAGNOSIS — M9903 Segmental and somatic dysfunction of lumbar region: Secondary | ICD-10-CM | POA: Diagnosis not present

## 2017-12-12 DIAGNOSIS — H9313 Tinnitus, bilateral: Secondary | ICD-10-CM | POA: Diagnosis not present

## 2017-12-12 DIAGNOSIS — M9902 Segmental and somatic dysfunction of thoracic region: Secondary | ICD-10-CM | POA: Diagnosis not present

## 2017-12-12 DIAGNOSIS — M9905 Segmental and somatic dysfunction of pelvic region: Secondary | ICD-10-CM | POA: Diagnosis not present

## 2017-12-12 DIAGNOSIS — M9901 Segmental and somatic dysfunction of cervical region: Secondary | ICD-10-CM | POA: Diagnosis not present

## 2017-12-12 DIAGNOSIS — F411 Generalized anxiety disorder: Secondary | ICD-10-CM | POA: Diagnosis not present

## 2017-12-12 DIAGNOSIS — M9903 Segmental and somatic dysfunction of lumbar region: Secondary | ICD-10-CM | POA: Diagnosis not present

## 2017-12-16 DIAGNOSIS — M9903 Segmental and somatic dysfunction of lumbar region: Secondary | ICD-10-CM | POA: Diagnosis not present

## 2017-12-16 DIAGNOSIS — M9905 Segmental and somatic dysfunction of pelvic region: Secondary | ICD-10-CM | POA: Diagnosis not present

## 2017-12-16 DIAGNOSIS — M9901 Segmental and somatic dysfunction of cervical region: Secondary | ICD-10-CM | POA: Diagnosis not present

## 2017-12-16 DIAGNOSIS — M9902 Segmental and somatic dysfunction of thoracic region: Secondary | ICD-10-CM | POA: Diagnosis not present

## 2018-01-13 ENCOUNTER — Ambulatory Visit
Admission: RE | Admit: 2018-01-13 | Discharge: 2018-01-13 | Disposition: A | Discharge status: Home / Self Care | Payer: BLUE CROSS/BLUE SHIELD | Source: Ambulatory Visit | Attending: Family Medicine | Admitting: Family Medicine

## 2018-01-13 ENCOUNTER — Ambulatory Visit (INDEPENDENT_AMBULATORY_CARE_PROVIDER_SITE_OTHER): Payer: BLUE CROSS/BLUE SHIELD | Admitting: Family Medicine

## 2018-01-13 DIAGNOSIS — X58XXXA Exposure to other specified factors, initial encounter: Secondary | ICD-10-CM | POA: Insufficient documentation

## 2018-01-13 DIAGNOSIS — S99912A Unspecified injury of left ankle, initial encounter: Secondary | ICD-10-CM

## 2018-01-13 DIAGNOSIS — M25572 Pain in left ankle and joints of left foot: Secondary | ICD-10-CM | POA: Insufficient documentation

## 2018-01-13 MED ORDER — NAPROXEN 500 MG PO TABS: 500.0000 mg | ORAL_TABLET | Freq: Two times a day (BID) | ORAL | 0 refills | Status: AC

## 2018-01-13 NOTE — Progress Notes (Signed)
Patient presents today with symptoms of left ankle pain and swelling. Patient states that she injured it on Friday playing softball. She went over first base and rolled her ankle. Patient states that she was able to continue playing for the rest of the game and on Saturday. She admits to having swelling and bruising of the area. She denies any foot pain. She is in a walking boot and denies any pain in the boot with walking. She is not taking any medications for her symptoms. She has used ice on the area. She admits to a left ankle fracture approximately 4 years ago which was treated by wearing a walking boot. She denies any surgical procedures on her ankle or foot. She denies any other injuries today.  ROS: Negative except mentioned above. Vitals as per Epic. GENERAL: NAD MSK: Left ankle - mild ecchymosis and swelling primarily laterally, there is a small area of ecchymosis along mid anterior leg which is mildly tender to palpation, mild tenderness on medial and lateral malleolus and ATFL, negative drawer, negative talar tilt, negative squeeze test, full range of motion, no foot tenderness or swelling, NV intact NEURO: CN II-XII grossly intact   A/P: Left ankle injury- will get x-rays, RICE, Naprosyn when necessary, will advise trainer on plan of care after x-rays have been reviewed, seek medical attention if any persistent or worsening symptoms.

## 2018-01-15 DIAGNOSIS — S93402A Sprain of unspecified ligament of left ankle, initial encounter: Secondary | ICD-10-CM | POA: Diagnosis not present

## 2018-05-18 DIAGNOSIS — S61211A Laceration without foreign body of left index finger without damage to nail, initial encounter: Secondary | ICD-10-CM | POA: Diagnosis not present

## 2018-05-25 DIAGNOSIS — Z4802 Encounter for removal of sutures: Secondary | ICD-10-CM | POA: Diagnosis not present

## 2018-05-25 DIAGNOSIS — S61211D Laceration without foreign body of left index finger without damage to nail, subsequent encounter: Secondary | ICD-10-CM | POA: Diagnosis not present

## 2018-06-30 IMAGING — CR DG ANKLE COMPLETE 3+V*L*
1 series · 3 of 3 positions shown · non-contrast
Comparison: None.

CLINICAL DATA: Left ankle pain after injury playing softball three
days ago.

EXAM:
LEFT ANKLE COMPLETE - 3+ VIEW

[Series 1: dg ankle complete left · 0.14mm/px · 3 of 3 slices shown]
[im 1/3]
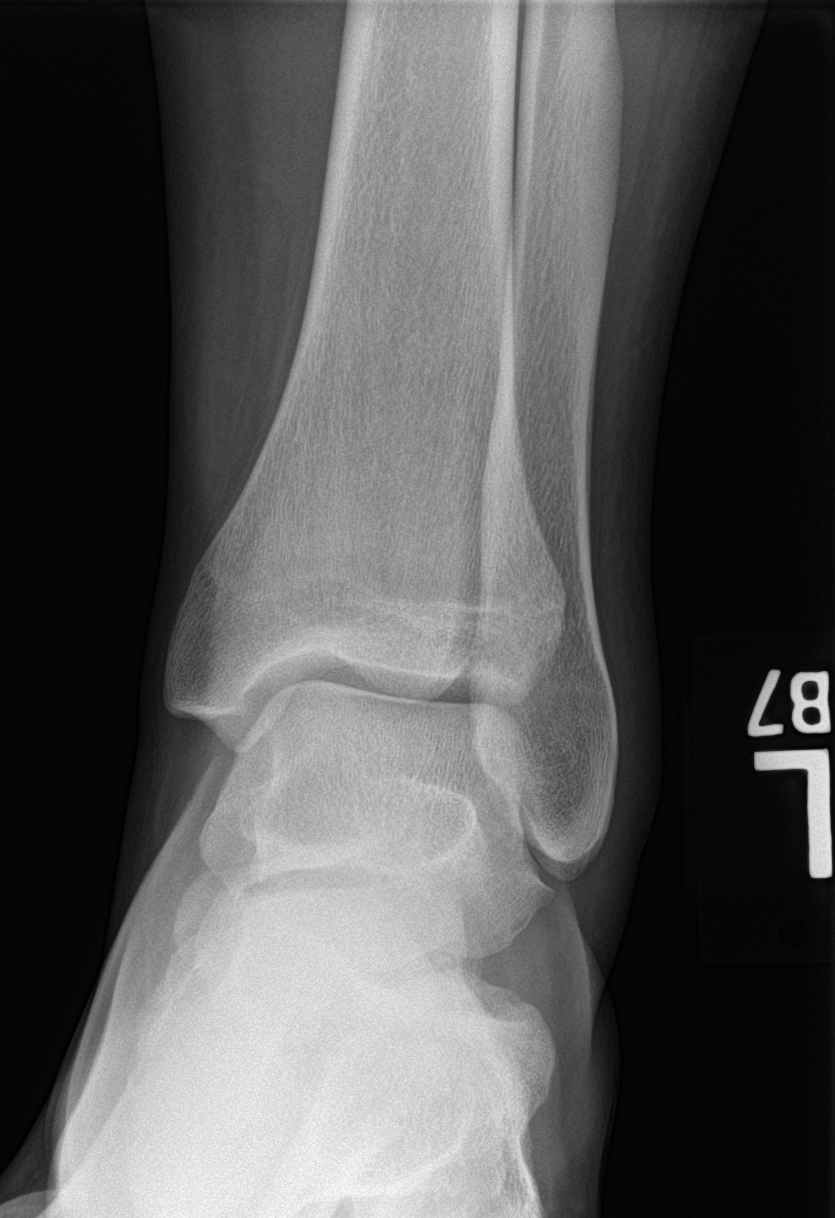
[im 2/3]
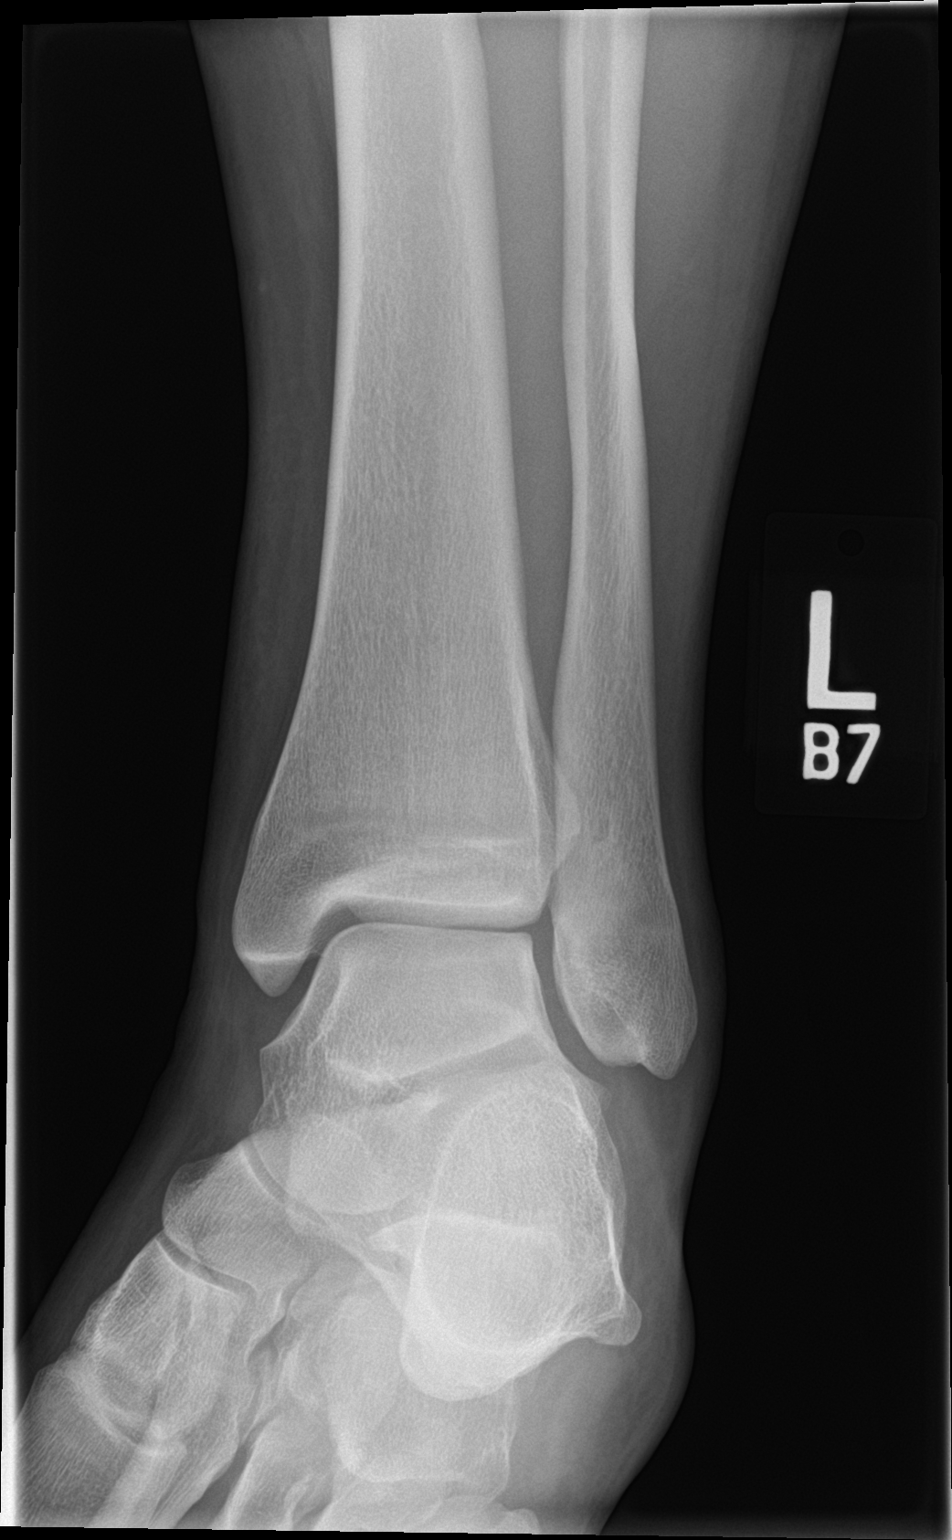
[im 3/3]
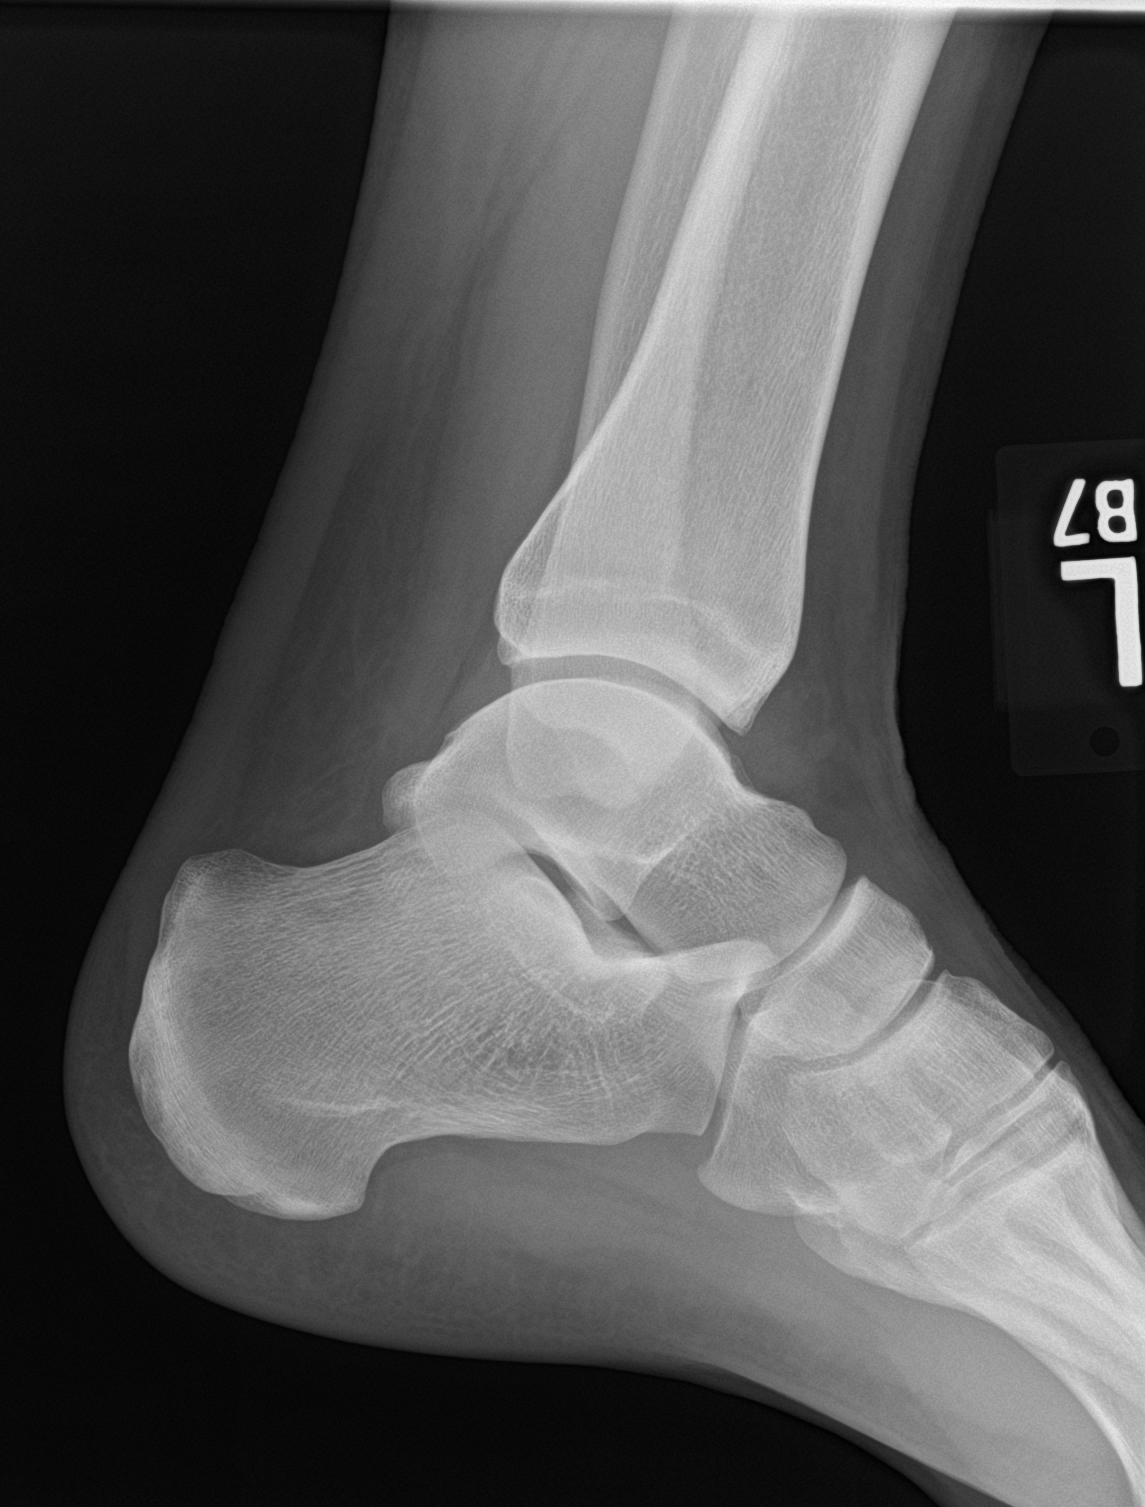

[3 of 3 positions shown; findings below may reference images not displayed]

FINDINGS: There is no evidence of fracture, dislocation, or joint effusion.
There is no evidence of arthropathy or other focal bone abnormality.
Soft tissues are unremarkable.
IMPRESSION: Normal left ankle.

## 2018-07-25 DIAGNOSIS — Z01419 Encounter for gynecological examination (general) (routine) without abnormal findings: Secondary | ICD-10-CM | POA: Diagnosis not present

## 2018-08-12 DIAGNOSIS — H5203 Hypermetropia, bilateral: Secondary | ICD-10-CM | POA: Diagnosis not present

## 2018-08-13 DIAGNOSIS — L309 Dermatitis, unspecified: Secondary | ICD-10-CM | POA: Diagnosis not present

## 2018-10-19 DIAGNOSIS — J019 Acute sinusitis, unspecified: Secondary | ICD-10-CM | POA: Diagnosis not present

## 2018-11-13 DIAGNOSIS — M9902 Segmental and somatic dysfunction of thoracic region: Secondary | ICD-10-CM | POA: Diagnosis not present

## 2018-11-13 DIAGNOSIS — M25552 Pain in left hip: Secondary | ICD-10-CM | POA: Diagnosis not present

## 2018-11-13 DIAGNOSIS — S39012A Strain of muscle, fascia and tendon of lower back, initial encounter: Secondary | ICD-10-CM | POA: Diagnosis not present

## 2018-11-13 DIAGNOSIS — M9904 Segmental and somatic dysfunction of sacral region: Secondary | ICD-10-CM | POA: Diagnosis not present

## 2018-11-13 DIAGNOSIS — M9901 Segmental and somatic dysfunction of cervical region: Secondary | ICD-10-CM | POA: Diagnosis not present

## 2018-11-13 DIAGNOSIS — M9906 Segmental and somatic dysfunction of lower extremity: Secondary | ICD-10-CM | POA: Diagnosis not present

## 2018-11-13 DIAGNOSIS — M9903 Segmental and somatic dysfunction of lumbar region: Secondary | ICD-10-CM | POA: Diagnosis not present

## 2018-11-13 DIAGNOSIS — M7918 Myalgia, other site: Secondary | ICD-10-CM | POA: Diagnosis not present

## 2018-11-13 DIAGNOSIS — S161XXA Strain of muscle, fascia and tendon at neck level, initial encounter: Secondary | ICD-10-CM | POA: Diagnosis not present

## 2018-11-17 DIAGNOSIS — M9901 Segmental and somatic dysfunction of cervical region: Secondary | ICD-10-CM | POA: Diagnosis not present

## 2018-11-17 DIAGNOSIS — M9902 Segmental and somatic dysfunction of thoracic region: Secondary | ICD-10-CM | POA: Diagnosis not present

## 2018-11-17 DIAGNOSIS — M25552 Pain in left hip: Secondary | ICD-10-CM | POA: Diagnosis not present

## 2018-11-17 DIAGNOSIS — M7918 Myalgia, other site: Secondary | ICD-10-CM | POA: Diagnosis not present

## 2018-11-17 DIAGNOSIS — M9906 Segmental and somatic dysfunction of lower extremity: Secondary | ICD-10-CM | POA: Diagnosis not present

## 2018-11-17 DIAGNOSIS — M9903 Segmental and somatic dysfunction of lumbar region: Secondary | ICD-10-CM | POA: Diagnosis not present

## 2018-11-17 DIAGNOSIS — S39012D Strain of muscle, fascia and tendon of lower back, subsequent encounter: Secondary | ICD-10-CM | POA: Diagnosis not present

## 2018-11-17 DIAGNOSIS — M9904 Segmental and somatic dysfunction of sacral region: Secondary | ICD-10-CM | POA: Diagnosis not present

## 2018-11-17 DIAGNOSIS — S161XXD Strain of muscle, fascia and tendon at neck level, subsequent encounter: Secondary | ICD-10-CM | POA: Diagnosis not present

## 2018-11-20 DIAGNOSIS — M9901 Segmental and somatic dysfunction of cervical region: Secondary | ICD-10-CM | POA: Diagnosis not present

## 2018-11-20 DIAGNOSIS — M25552 Pain in left hip: Secondary | ICD-10-CM | POA: Diagnosis not present

## 2018-11-20 DIAGNOSIS — S161XXD Strain of muscle, fascia and tendon at neck level, subsequent encounter: Secondary | ICD-10-CM | POA: Diagnosis not present

## 2018-11-20 DIAGNOSIS — M9903 Segmental and somatic dysfunction of lumbar region: Secondary | ICD-10-CM | POA: Diagnosis not present

## 2018-11-20 DIAGNOSIS — M9904 Segmental and somatic dysfunction of sacral region: Secondary | ICD-10-CM | POA: Diagnosis not present

## 2018-11-20 DIAGNOSIS — S39012D Strain of muscle, fascia and tendon of lower back, subsequent encounter: Secondary | ICD-10-CM | POA: Diagnosis not present

## 2018-11-20 DIAGNOSIS — M7918 Myalgia, other site: Secondary | ICD-10-CM | POA: Diagnosis not present

## 2018-11-20 DIAGNOSIS — M9902 Segmental and somatic dysfunction of thoracic region: Secondary | ICD-10-CM | POA: Diagnosis not present

## 2018-11-20 DIAGNOSIS — M9906 Segmental and somatic dysfunction of lower extremity: Secondary | ICD-10-CM | POA: Diagnosis not present

## 2018-12-11 DIAGNOSIS — L02221 Furuncle of abdominal wall: Secondary | ICD-10-CM | POA: Diagnosis not present

## 2018-12-11 DIAGNOSIS — L02422 Furuncle of left axilla: Secondary | ICD-10-CM | POA: Diagnosis not present

## 2018-12-11 DIAGNOSIS — L02421 Furuncle of right axilla: Secondary | ICD-10-CM | POA: Diagnosis not present

## 2018-12-15 DIAGNOSIS — F411 Generalized anxiety disorder: Secondary | ICD-10-CM | POA: Diagnosis not present

## 2019-02-23 DIAGNOSIS — M9902 Segmental and somatic dysfunction of thoracic region: Secondary | ICD-10-CM | POA: Diagnosis not present

## 2019-02-23 DIAGNOSIS — M9905 Segmental and somatic dysfunction of pelvic region: Secondary | ICD-10-CM | POA: Diagnosis not present

## 2019-02-23 DIAGNOSIS — M9901 Segmental and somatic dysfunction of cervical region: Secondary | ICD-10-CM | POA: Diagnosis not present

## 2019-02-23 DIAGNOSIS — M9903 Segmental and somatic dysfunction of lumbar region: Secondary | ICD-10-CM | POA: Diagnosis not present

## 2019-03-03 DIAGNOSIS — M9906 Segmental and somatic dysfunction of lower extremity: Secondary | ICD-10-CM | POA: Diagnosis not present

## 2019-03-03 DIAGNOSIS — S161XXD Strain of muscle, fascia and tendon at neck level, subsequent encounter: Secondary | ICD-10-CM | POA: Diagnosis not present

## 2019-03-03 DIAGNOSIS — M9901 Segmental and somatic dysfunction of cervical region: Secondary | ICD-10-CM | POA: Diagnosis not present

## 2019-03-03 DIAGNOSIS — M25552 Pain in left hip: Secondary | ICD-10-CM | POA: Diagnosis not present

## 2019-03-03 DIAGNOSIS — M9903 Segmental and somatic dysfunction of lumbar region: Secondary | ICD-10-CM | POA: Diagnosis not present

## 2019-03-03 DIAGNOSIS — M9904 Segmental and somatic dysfunction of sacral region: Secondary | ICD-10-CM | POA: Diagnosis not present

## 2019-03-03 DIAGNOSIS — M9902 Segmental and somatic dysfunction of thoracic region: Secondary | ICD-10-CM | POA: Diagnosis not present

## 2019-03-03 DIAGNOSIS — M7918 Myalgia, other site: Secondary | ICD-10-CM | POA: Diagnosis not present

## 2019-03-03 DIAGNOSIS — S39012D Strain of muscle, fascia and tendon of lower back, subsequent encounter: Secondary | ICD-10-CM | POA: Diagnosis not present

## 2019-05-25 DIAGNOSIS — F419 Anxiety disorder, unspecified: Secondary | ICD-10-CM | POA: Diagnosis not present

## 2019-05-25 DIAGNOSIS — L0390 Cellulitis, unspecified: Secondary | ICD-10-CM | POA: Diagnosis not present

## 2019-07-31 DIAGNOSIS — Z01419 Encounter for gynecological examination (general) (routine) without abnormal findings: Secondary | ICD-10-CM | POA: Diagnosis not present

## 2019-08-10 DIAGNOSIS — Z30430 Encounter for insertion of intrauterine contraceptive device: Secondary | ICD-10-CM | POA: Diagnosis not present

## 2019-08-14 ENCOUNTER — Encounter

## 2019-08-28 DIAGNOSIS — Z30430 Encounter for insertion of intrauterine contraceptive device: Secondary | ICD-10-CM | POA: Diagnosis not present

## 2019-08-28 DIAGNOSIS — Z3202 Encounter for pregnancy test, result negative: Secondary | ICD-10-CM | POA: Diagnosis not present

## 2019-08-28 DIAGNOSIS — M25552 Pain in left hip: Secondary | ICD-10-CM | POA: Diagnosis not present

## 2019-08-28 DIAGNOSIS — M25551 Pain in right hip: Secondary | ICD-10-CM | POA: Diagnosis not present

## 2019-09-10 DIAGNOSIS — M25552 Pain in left hip: Secondary | ICD-10-CM | POA: Diagnosis not present

## 2019-09-10 DIAGNOSIS — M25551 Pain in right hip: Secondary | ICD-10-CM | POA: Diagnosis not present

## 2019-09-18 DIAGNOSIS — M25511 Pain in right shoulder: Secondary | ICD-10-CM | POA: Diagnosis not present

## 2019-09-22 ENCOUNTER — Encounter

## 2019-09-22 DIAGNOSIS — M25511 Pain in right shoulder: Secondary | ICD-10-CM | POA: Diagnosis not present

## 2019-09-22 DIAGNOSIS — Z20828 Contact with and (suspected) exposure to other viral communicable diseases: Secondary | ICD-10-CM | POA: Diagnosis not present

## 2019-09-22 MED ORDER — CITALOPRAM 40 MG TAB
40 mg | ORAL_TABLET | Freq: Every day | ORAL | 0 refills | Status: DC
Start: 2019-09-22 — End: 2019-12-21

## 2019-09-29 DIAGNOSIS — T8332XA Displacement of intrauterine contraceptive device, initial encounter: Secondary | ICD-10-CM | POA: Diagnosis not present

## 2019-09-29 DIAGNOSIS — M25511 Pain in right shoulder: Secondary | ICD-10-CM | POA: Diagnosis not present

## 2019-09-29 DIAGNOSIS — Z30431 Encounter for routine checking of intrauterine contraceptive device: Secondary | ICD-10-CM | POA: Diagnosis not present

## 2019-10-05 DIAGNOSIS — M25511 Pain in right shoulder: Secondary | ICD-10-CM | POA: Diagnosis not present

## 2019-11-17 DIAGNOSIS — Z20828 Contact with and (suspected) exposure to other viral communicable diseases: Secondary | ICD-10-CM | POA: Diagnosis not present

## 2019-12-21 ENCOUNTER — Encounter

## 2019-12-21 MED ORDER — CITALOPRAM 40 MG TAB
40 mg | ORAL_TABLET | ORAL | 0 refills | Status: DC
Start: 2019-12-21 — End: 2020-07-08

## 2020-01-26 DIAGNOSIS — Z0000 Encounter for general adult medical examination without abnormal findings: Secondary | ICD-10-CM | POA: Diagnosis not present

## 2020-02-12 DIAGNOSIS — B373 Candidiasis of vulva and vagina: Secondary | ICD-10-CM | POA: Diagnosis not present

## 2020-02-12 DIAGNOSIS — N83202 Unspecified ovarian cyst, left side: Secondary | ICD-10-CM | POA: Diagnosis not present

## 2020-02-12 DIAGNOSIS — Z30431 Encounter for routine checking of intrauterine contraceptive device: Secondary | ICD-10-CM | POA: Diagnosis not present

## 2020-02-12 DIAGNOSIS — R1032 Left lower quadrant pain: Secondary | ICD-10-CM | POA: Diagnosis not present

## 2020-02-29 ENCOUNTER — Ambulatory Visit
Admit: 2020-02-29 | Discharge: 2020-02-29 | Payer: BLUE CROSS/BLUE SHIELD | Primary: Student in an Organized Health Care Education/Training Program

## 2020-02-29 DIAGNOSIS — Z111 Encounter for screening for respiratory tuberculosis: Secondary | ICD-10-CM | POA: Diagnosis not present

## 2020-02-29 MED ORDER — TUBERCULIN PPD 5 UNIT/0.1 ML INTRADERMAL
5 tub. unit /0.1 mL | Freq: Once | INTRADERMAL | Status: AC
Start: 2020-02-29 — End: 2020-03-01

## 2020-03-02 ENCOUNTER — Institutional Professional Consult (permissible substitution)
Admit: 2020-03-02 | Discharge: 2020-03-02 | Payer: BLUE CROSS/BLUE SHIELD | Primary: Student in an Organized Health Care Education/Training Program

## 2020-03-02 DIAGNOSIS — Z111 Encounter for screening for respiratory tuberculosis: Secondary | ICD-10-CM | POA: Diagnosis not present

## 2020-03-02 DIAGNOSIS — Z23 Encounter for immunization: Secondary | ICD-10-CM

## 2020-03-02 LAB — AMB POC TUBERCULOSIS, INTRADERMAL (SKIN TEST)
PPD: NEGATIVE Negative
mm Induration: 0 mm (ref 0–5)

## 2020-03-09 DIAGNOSIS — L0292 Furuncle, unspecified: Secondary | ICD-10-CM | POA: Diagnosis not present

## 2020-03-09 DIAGNOSIS — L718 Other rosacea: Secondary | ICD-10-CM | POA: Diagnosis not present

## 2020-03-09 DIAGNOSIS — L700 Acne vulgaris: Secondary | ICD-10-CM | POA: Diagnosis not present

## 2020-03-10 DIAGNOSIS — Z20822 Contact with and (suspected) exposure to covid-19: Secondary | ICD-10-CM | POA: Diagnosis not present

## 2020-06-14 DIAGNOSIS — L259 Unspecified contact dermatitis, unspecified cause: Secondary | ICD-10-CM | POA: Diagnosis not present

## 2020-06-14 DIAGNOSIS — L719 Rosacea, unspecified: Secondary | ICD-10-CM | POA: Diagnosis not present

## 2020-06-14 DIAGNOSIS — B9689 Other specified bacterial agents as the cause of diseases classified elsewhere: Secondary | ICD-10-CM | POA: Diagnosis not present

## 2020-06-14 DIAGNOSIS — B373 Candidiasis of vulva and vagina: Secondary | ICD-10-CM | POA: Diagnosis not present

## 2020-06-14 DIAGNOSIS — Z113 Encounter for screening for infections with a predominantly sexual mode of transmission: Secondary | ICD-10-CM | POA: Diagnosis not present

## 2020-06-20 DIAGNOSIS — B373 Candidiasis of vulva and vagina: Secondary | ICD-10-CM | POA: Diagnosis not present

## 2020-06-20 DIAGNOSIS — R5383 Other fatigue: Secondary | ICD-10-CM | POA: Diagnosis not present

## 2020-07-04 DIAGNOSIS — Z20822 Contact with and (suspected) exposure to covid-19: Secondary | ICD-10-CM | POA: Diagnosis not present

## 2020-07-08 ENCOUNTER — Encounter

## 2020-07-08 MED ORDER — CITALOPRAM 40 MG TAB
40 mg | ORAL_TABLET | Freq: Every day | ORAL | 0 refills | Status: DC
Start: 2020-07-08 — End: 2020-09-30

## 2020-08-16 ENCOUNTER — Institutional Professional Consult (permissible substitution): Payer: BLUE CROSS/BLUE SHIELD | Primary: Student in an Organized Health Care Education/Training Program

## 2020-08-16 DIAGNOSIS — N949 Unspecified condition associated with female genital organs and menstrual cycle: Secondary | ICD-10-CM | POA: Diagnosis not present

## 2020-08-16 LAB — AMB POC URINALYSIS DIP STICK AUTO W/O MICRO
Bilirubin (UA POC): NEGATIVE
Blood (UA POC): NEGATIVE
Glucose (UA POC): NEGATIVE
Nitrites (UA POC): NEGATIVE
Protein (UA POC): NEGATIVE
Specific gravity (UA POC): 1.025 (ref 1.001–1.035)
Urobilinogen (UA POC): 0.2 (ref 0.2–1)
pH (UA POC): 7 (ref 4.6–8.0)

## 2020-08-17 ENCOUNTER — Encounter

## 2020-08-17 MED ORDER — NITROFURANTOIN (25% MACROCRYSTAL FORM) 100 MG CAP
100 mg | ORAL_CAPSULE | Freq: Two times a day (BID) | ORAL | 0 refills | Status: AC
Start: 2020-08-17 — End: 2020-08-22

## 2020-08-29 DIAGNOSIS — Z01419 Encounter for gynecological examination (general) (routine) without abnormal findings: Secondary | ICD-10-CM | POA: Diagnosis not present

## 2020-08-29 DIAGNOSIS — A599 Trichomoniasis, unspecified: Secondary | ICD-10-CM | POA: Diagnosis not present

## 2020-08-29 DIAGNOSIS — Z113 Encounter for screening for infections with a predominantly sexual mode of transmission: Secondary | ICD-10-CM | POA: Diagnosis not present

## 2020-08-29 DIAGNOSIS — Z202 Contact with and (suspected) exposure to infections with a predominantly sexual mode of transmission: Secondary | ICD-10-CM | POA: Diagnosis not present

## 2020-08-29 DIAGNOSIS — Z1272 Encounter for screening for malignant neoplasm of vagina: Secondary | ICD-10-CM | POA: Diagnosis not present

## 2020-08-29 DIAGNOSIS — N898 Other specified noninflammatory disorders of vagina: Secondary | ICD-10-CM | POA: Diagnosis not present

## 2020-09-01 ENCOUNTER — Encounter: Attending: Family | Primary: Student in an Organized Health Care Education/Training Program

## 2020-09-09 DIAGNOSIS — J029 Acute pharyngitis, unspecified: Secondary | ICD-10-CM | POA: Diagnosis not present

## 2020-09-09 DIAGNOSIS — Z20822 Contact with and (suspected) exposure to covid-19: Secondary | ICD-10-CM | POA: Diagnosis not present

## 2020-09-09 DIAGNOSIS — J121 Respiratory syncytial virus pneumonia: Secondary | ICD-10-CM | POA: Diagnosis not present

## 2020-09-09 DIAGNOSIS — B974 Respiratory syncytial virus as the cause of diseases classified elsewhere: Secondary | ICD-10-CM | POA: Diagnosis not present

## 2020-09-12 DIAGNOSIS — Z975 Presence of (intrauterine) contraceptive device: Secondary | ICD-10-CM | POA: Diagnosis not present

## 2020-09-12 DIAGNOSIS — N83202 Unspecified ovarian cyst, left side: Secondary | ICD-10-CM | POA: Diagnosis not present

## 2020-09-19 DIAGNOSIS — M9901 Segmental and somatic dysfunction of cervical region: Secondary | ICD-10-CM | POA: Diagnosis not present

## 2020-09-19 DIAGNOSIS — M9903 Segmental and somatic dysfunction of lumbar region: Secondary | ICD-10-CM | POA: Diagnosis not present

## 2020-09-19 DIAGNOSIS — M9905 Segmental and somatic dysfunction of pelvic region: Secondary | ICD-10-CM | POA: Diagnosis not present

## 2020-09-19 DIAGNOSIS — M9902 Segmental and somatic dysfunction of thoracic region: Secondary | ICD-10-CM | POA: Diagnosis not present

## 2020-09-30 ENCOUNTER — Encounter

## 2020-09-30 MED ORDER — CITALOPRAM 40 MG TAB
40 mg | ORAL_TABLET | Freq: Every day | ORAL | 0 refills | Status: DC
Start: 2020-09-30 — End: 2021-01-02

## 2020-10-12 DIAGNOSIS — L739 Follicular disorder, unspecified: Secondary | ICD-10-CM | POA: Diagnosis not present

## 2020-10-12 DIAGNOSIS — L662 Folliculitis decalvans: Secondary | ICD-10-CM | POA: Diagnosis not present

## 2020-10-12 DIAGNOSIS — L719 Rosacea, unspecified: Secondary | ICD-10-CM | POA: Diagnosis not present

## 2020-10-25 ENCOUNTER — Institutional Professional Consult (permissible substitution)
Admit: 2020-10-25 | Discharge: 2020-10-25 | Payer: BLUE CROSS/BLUE SHIELD | Primary: Student in an Organized Health Care Education/Training Program

## 2020-10-25 DIAGNOSIS — Z111 Encounter for screening for respiratory tuberculosis: Secondary | ICD-10-CM | POA: Diagnosis not present

## 2020-10-25 DIAGNOSIS — M222X2 Patellofemoral disorders, left knee: Secondary | ICD-10-CM | POA: Diagnosis not present

## 2020-10-25 DIAGNOSIS — M25552 Pain in left hip: Secondary | ICD-10-CM | POA: Diagnosis not present

## 2020-10-25 DIAGNOSIS — S73192A Other sprain of left hip, initial encounter: Secondary | ICD-10-CM | POA: Diagnosis not present

## 2020-10-25 DIAGNOSIS — Z0000 Encounter for general adult medical examination without abnormal findings: Secondary | ICD-10-CM

## 2020-10-25 MED ORDER — TUBERCULIN PPD 5 UNIT/0.1 ML INTRADERMAL
5 tub. unit /0.1 mL | Freq: Once | INTRADERMAL | Status: AC
Start: 2020-10-25 — End: 2020-10-26

## 2020-10-27 ENCOUNTER — Institutional Professional Consult (permissible substitution)
Admit: 2020-10-27 | Payer: BLUE CROSS/BLUE SHIELD | Primary: Student in an Organized Health Care Education/Training Program

## 2020-10-27 DIAGNOSIS — Z0000 Encounter for general adult medical examination without abnormal findings: Secondary | ICD-10-CM

## 2020-10-27 LAB — AMB POC TUBERCULOSIS, INTRADERMAL (SKIN TEST)
PPD: NEGATIVE Negative
mm Induration: 0 mm (ref 0–5)

## 2020-11-04 DIAGNOSIS — Z20822 Contact with and (suspected) exposure to covid-19: Secondary | ICD-10-CM | POA: Diagnosis not present

## 2020-11-10 DIAGNOSIS — Z20822 Contact with and (suspected) exposure to covid-19: Secondary | ICD-10-CM | POA: Diagnosis not present

## 2020-11-17 DIAGNOSIS — S79812A Other specified injuries of left hip, initial encounter: Secondary | ICD-10-CM | POA: Diagnosis not present

## 2020-11-17 DIAGNOSIS — M1612 Unilateral primary osteoarthritis, left hip: Secondary | ICD-10-CM | POA: Diagnosis not present

## 2020-11-17 DIAGNOSIS — M948X8 Other specified disorders of cartilage, other site: Secondary | ICD-10-CM | POA: Diagnosis not present

## 2021-01-02 ENCOUNTER — Encounter

## 2021-01-02 MED ORDER — CITALOPRAM 40 MG TAB
40 mg | ORAL_TABLET | ORAL | 0 refills | Status: DC
Start: 2021-01-02 — End: 2021-04-02

## 2021-04-02 ENCOUNTER — Encounter

## 2021-04-02 MED ORDER — CITALOPRAM 40 MG TAB
40 mg | ORAL_TABLET | ORAL | 0 refills | Status: AC
Start: 2021-04-02 — End: ?

## 2021-08-29 ENCOUNTER — Inpatient Hospital Stay: Admit: 2021-08-29 | Payer: BLUE CROSS/BLUE SHIELD | Primary: Family Medicine

## 2021-09-01 ENCOUNTER — Encounter: Payer: BLUE CROSS/BLUE SHIELD | Primary: Family Medicine

## 2021-09-04 ENCOUNTER — Inpatient Hospital Stay: Admit: 2021-09-04 | Payer: BLUE CROSS/BLUE SHIELD | Primary: Family Medicine

## 2021-09-07 ENCOUNTER — Encounter: Payer: BLUE CROSS/BLUE SHIELD | Primary: Family Medicine

## 2021-09-21 ENCOUNTER — Encounter: Payer: BLUE CROSS/BLUE SHIELD | Primary: Family Medicine

## 2021-09-22 ENCOUNTER — Inpatient Hospital Stay: Admit: 2021-09-22 | Payer: BLUE CROSS/BLUE SHIELD | Primary: Family Medicine

## 2021-09-22 DIAGNOSIS — Q6589 Other specified congenital deformities of hip: Secondary | ICD-10-CM

## 2021-09-25 ENCOUNTER — Encounter: Payer: BLUE CROSS/BLUE SHIELD | Primary: Family Medicine

## 2021-10-03 ENCOUNTER — Inpatient Hospital Stay: Admit: 2021-10-03 | Payer: BLUE CROSS/BLUE SHIELD | Primary: Family Medicine
